# Patient Record
Sex: Male | Born: 1980 | Race: Black or African American | Hispanic: No | Marital: Single | Smoking: Never smoker
Health system: Southern US, Community
[De-identification: ages and names within clinical notes are randomized; demographics above are authoritative.]

## PROBLEM LIST (undated history)

## (undated) HISTORY — PX: KNEE ARTHROPLASTY: SHX992

---

## 2015-02-06 ENCOUNTER — Emergency Department (HOSPITAL_COMMUNITY)
Admission: EM | Admit: 2015-02-06 | Discharge: 2015-02-06 | Disposition: A | Discharge status: Home / Self Care | Payer: Self-pay | Attending: Emergency Medicine | Admitting: Emergency Medicine

## 2015-02-06 ENCOUNTER — Encounter (HOSPITAL_COMMUNITY): Payer: Self-pay | Admitting: Emergency Medicine

## 2015-02-06 DIAGNOSIS — B86 Scabies: Secondary | ICD-10-CM | POA: Insufficient documentation

## 2015-02-06 DIAGNOSIS — B354 Tinea corporis: Secondary | ICD-10-CM | POA: Insufficient documentation

## 2015-02-06 MED ORDER — CLOTRIMAZOLE 1 % EX CREA: TOPICAL_CREAM | CUTANEOUS | Status: AC

## 2015-02-06 MED ORDER — CLOTRIMAZOLE 1 % EX CREA
TOPICAL_CREAM | Freq: Once | CUTANEOUS | Status: AC
  Administered 2015-02-06: 21:00:00 via TOPICAL
  Filled 2015-02-06: qty 15

## 2015-02-06 MED ORDER — PERMETHRIN 5 % EX CREA
TOPICAL_CREAM | Freq: Once | CUTANEOUS | Status: AC
  Administered 2015-02-06: 21:00:00 via TOPICAL
  Filled 2015-02-06: qty 60

## 2015-02-06 NOTE — ED Notes (Signed)
The patient said he was cleaning a house that was filthy.  He says he started itching about 3 to 4 weeks ago and then he started getting sores al over his body.  The patient said this house was abandoned house and it had a lot of trash, dog feces and other nastiness in it.  [facility] and [facility] told him he had scabies and gave him a cream.  He says the cream did not work.  He is in pain from the sores and the itching.

## 2015-02-06 NOTE — ED Provider Notes (Signed)
CSN: [id number]     Arrival date & time 02/06/15  1935 History   First MD Initiated Contact with Patient 02/06/15 2029     Chief Complaint  Patient presents with  . Rash    The patient said he was cleaning a house that was filthy.  He says he started itching about 3 to 4 weeks ago and then he started getting sores al over his body.     (Consider location/radiation/quality/duration/timing/severity/associated sxs/prior Treatment) HPI Comments: The patient said he was cleaning a house that was filthy. He says he started itching about 3 to 4 weeks ago and then he started getting sores al over his body. The patient said this house was abandoned house and it had a lot of trash, dog feces and other nastiness in it. He went to [facility] and they told him he had scabies and gave him a cream. He states he was unable to afford the cream to use for his rash. Denies any fevers, chills, nausea, vomiting.    History reviewed. No pertinent past medical history. Past Surgical History  Procedure Laterality Date  . Knee arthroplasty     History reviewed. No pertinent family history. History  Substance Use Topics  . Smoking status: Never Smoker   . Smokeless tobacco: Never Used  . Alcohol Use: Yes     Comment: occ    Review of Systems  Skin: Positive for rash.  All other systems reviewed and are negative.     Allergies  Review of patient's allergies indicates not on file.  Home Medications   Prior to Admission medications   Medication Sig Start Date End Date Taking? Authorizing Provider  clotrimazole (LOTRIMIN) 1 % cream Apply to affected area 2 times daily 02/06/15   Johna Kearl L Saydi Kobel, PA-C   BP 134/84 mmHg  Pulse 56  Temp(Src) 98.4 F (36.9 C) (Oral)  Resp 18  SpO2 99% Physical Exam  Constitutional: He is oriented to person, place, and time. He appears well-developed and well-nourished. No distress.  HENT:  Head: Normocephalic and atraumatic.  Right Ear: External  ear normal.  Left Ear: External ear normal.  Nose: Nose normal.  Mouth/Throat: Oropharynx is clear and moist.  Eyes: Conjunctivae are normal.  Neck: Normal range of motion. Neck supple.  Cardiovascular: Normal rate, regular rhythm and normal heart sounds.   Pulmonary/Chest: Effort normal and breath sounds normal. No respiratory distress.  Abdominal: Soft. There is no tenderness.  Musculoskeletal: Normal range of motion.  Neurological: He is alert and oriented to person, place, and time.  Skin: Skin is warm and dry. Rash noted. He is not diaphoretic.  Erythematous maculopapules noted in between fingers on bilateral hands.  Annular lesions with central clearing without erythema or warmth noted on bilateral forearms and bilateral LE.   Psychiatric: He has a normal mood and affect.  Nursing note and vitals reviewed.   ED Course  Procedures (including critical care time) Medications  clotrimazole (LOTRIMIN) 1 % cream ( Topical Given 02/06/15 2111)  permethrin (ELIMITE) 5 % cream ( Topical Given 02/06/15 2111)    Labs Review Labs Reviewed - No data to display  Imaging Review No results found.   EKG Interpretation None      MDM   Final diagnoses:  Scabies  Tinea corporis    Filed Vitals:   02/06/15 2057  BP: 134/84  Pulse: 56  Temp: 98.4 F (36.9 C)  Resp: 18   Afebrile, NAD, non-toxic appearing, AAOx4.   1) Scabies:  Discussed diagnosis & treatment of scabies with parents.  They have been advised to followup with her primary care doctor 2 weeks after treatment.  They have also been advised to clean entire household including washing sheets and using R.I.D. spray in the car and on sofa.   The use of permethrin cream was discussed as well, they were told to use cream from head to toe & leave on child for 8-12 hours.    2) Tinea corporis: No evidence of super imposed infection. Will prescribe lotrimin cream.   Return precautions discussed. Patient is agreeable to plan.  Patient is stable at time of discharge      Jeannetta EllisJennifer L Jagger Beahm, PA-C 02/07/15 2113  Vanetta MuldersScott Zackowski, MD 02/08/15 2256

## 2015-02-06 NOTE — Discharge Instructions (Signed)
Please follow up with your primary care physician in 1-2 days. If you do not have one please call the [facility] and wellness Center number listed above. Please read all discharge instructions and return precautions.    Scabies Scabies are small bugs (mites) that burrow under the skin and cause red bumps and severe itching. These bugs can only be seen with a microscope. Scabies are highly contagious. They can spread easily from person to person by direct contact. They are also spread through sharing clothing or linens that have the scabies mites living in them. It is not unusual for an entire family to become infected through shared towels, clothing, or bedding.  HOME CARE INSTRUCTIONS   Your caregiver may prescribe a cream or lotion to kill the mites. If cream is prescribed, massage the cream into the entire body from the neck to the bottom of both feet. Also massage the cream into the scalp and face if your child is less than 36 year old. Avoid the eyes and mouth. Do not wash your hands after application.  Leave the cream on for 8 to 12 hours. Your child should bathe or shower after the 8 to 12 hour application period. Sometimes it is helpful to apply the cream to your child right before bedtime.  One treatment is usually effective and will eliminate approximately 95% of infestations. For severe cases, your caregiver may decide to repeat the treatment in 1 week. Everyone in your household should be treated with one application of the cream.  New rashes or burrows should not appear within 24 to 48 hours after successful treatment. However, the itching and rash may last for 2 to 4 weeks after successful treatment. Your caregiver may prescribe a medicine to help with the itching or to help the rash go away more quickly.  Scabies can live on clothing or linens for up to 3 days. All of your child's recently used clothing, towels, stuffed toys, and bed linens should be washed in hot water and then dried  in a dryer for at least 20 minutes on high heat. Items that cannot be washed should be enclosed in a plastic bag for at least 3 days.  To help relieve itching, bathe your child in a cool bath or apply cool washcloths to the affected areas.  Your child may return to school after treatment with the prescribed cream. SEEK MEDICAL CARE IF:   The itching persists longer than 4 weeks after treatment.  The rash spreads or becomes infected. Signs of infection include red blisters or yellow-tan crust. Document Released: 11/28/2005 Document Revised: 02/20/2012 Document Reviewed: 04/08/2009 [facility] Patient Information 2015 [place], [place]. This information is not intended to replace advice given to you by your health care provider. Make sure you discuss any questions you have with your health care provider.  Body Ringworm Ringworm (tinea corporis) is a fungal infection of the skin on the body. This infection is not caused by worms, but is actually caused by a fungus. Fungus normally lives on the top of your skin and can be useful. However, in the case of ringworms, the fungus grows out of control and causes a skin infection. It can involve any area of skin on the body and can spread easily from one person to another (contagious). Ringworm is a common problem for children, but it can affect adults as well. Ringworm is also often found in athletes, especially wrestlers who share equipment and mats.  CAUSES  Ringworm of the body is  caused by a fungus called dermatophyte. It can spread by:  Touchingother people who are infected.  Touchinginfected pets.  Touching or sharingobjects that have been in contact with the infected person or pet (hats, combs, towels, clothing, sports equipment). SYMPTOMS   Itchy, raised red spots and bumps on the skin.  Ring-shaped rash.  Redness near the border of the rash with a clear center.  Dry and scaly skin on or around the rash. Not every person develops a  ring-shaped rash. Some develop only the red, scaly patches. DIAGNOSIS  Most often, ringworm can be diagnosed by performing a skin exam. Your caregiver may choose to take a skin scraping from the affected area. The sample will be examined under the microscope to see if the fungus is present.  TREATMENT  Body ringworm may be treated with a topical antifungal cream or ointment. Sometimes, an antifungal shampoo that can be used on your body is prescribed. You may be prescribed antifungal medicines to take by mouth if your ringworm is severe, keeps coming back, or lasts a long time.  HOME CARE INSTRUCTIONS   Only take over-the-counter or prescription medicines as directed by your caregiver.  Wash the infected area and dry it completely before applying yourcream or ointment.  When using antifungal shampoo to treat the ringworm, leave the shampoo on the body for 3-5 minutes before rinsing.   Wear loose clothing to stop clothes from rubbing and irritating the rash.  Wash or change your bed sheets every night while you have the rash.  Have your pet treated by your veterinarian if it has the same infection. To prevent ringworm:   Practice good hygiene.  Wear sandals or shoes in public places and showers.  Do not share personal items with others.  Avoid touching red patches of skin on other people.  Avoid touching pets that have bald spots or wash your hands after doing so. SEEK MEDICAL CARE IF:   Your rash continues to spread after 7 days of treatment.  Your rash is not gone in 4 weeks.  The area around your rash becomes red, warm, tender, and swollen. Document Released: 11/25/2000 Document Revised: 08/22/2012 Document Reviewed: 06/11/2012 [facility] Patient Information 2015 [place]. This information is not intended to replace advice given to you by your health care provider. Make sure you discuss any questions you have with your health care provider.

## 2015-02-19 ENCOUNTER — Encounter (HOSPITAL_COMMUNITY): Payer: Self-pay

## 2015-02-19 ENCOUNTER — Emergency Department (HOSPITAL_COMMUNITY)
Admission: EM | Admit: 2015-02-19 | Discharge: 2015-02-19 | Disposition: A | Discharge status: Home / Self Care | Payer: Self-pay | Attending: Emergency Medicine | Admitting: Emergency Medicine

## 2015-02-19 DIAGNOSIS — K029 Dental caries, unspecified: Secondary | ICD-10-CM | POA: Insufficient documentation

## 2015-02-19 DIAGNOSIS — K088 Other specified disorders of teeth and supporting structures: Secondary | ICD-10-CM | POA: Insufficient documentation

## 2015-02-19 DIAGNOSIS — Z79899 Other long term (current) drug therapy: Secondary | ICD-10-CM | POA: Insufficient documentation

## 2015-02-19 DIAGNOSIS — K0381 Cracked tooth: Secondary | ICD-10-CM | POA: Insufficient documentation

## 2015-02-19 MED ORDER — HYDROCODONE-ACETAMINOPHEN 5-325 MG PO TABS: 1.0000 | ORAL_TABLET | ORAL | Status: AC | PRN

## 2015-02-19 MED ORDER — IBUPROFEN 800 MG PO TABS: 800.0000 mg | ORAL_TABLET | Freq: Three times a day (TID) | ORAL | Status: AC | PRN

## 2015-02-19 MED ORDER — PENICILLIN V POTASSIUM 500 MG PO TABS: 500.0000 mg | ORAL_TABLET | Freq: Four times a day (QID) | ORAL | Status: AC

## 2015-02-19 NOTE — Discharge Instructions (Signed)
Dental Care and Dentist Visits °Dental care supports good overall health. Regular dental visits can also help you avoid dental pain, bleeding, infection, and other more serious health problems in the future. It is important to keep the mouth healthy because diseases in the teeth, gums, and other oral tissues can spread to other areas of the body. Some problems, such as diabetes, heart disease, and pre-term labor have been associated with poor oral health.  °See your dentist every 6 months. If you experience emergency problems such as a toothache or broken tooth, go to the dentist right away. If you see your dentist regularly, you may catch problems early. It is easier to be treated for problems in the early stages.  °WHAT TO EXPECT AT A DENTIST VISIT  °Your dentist will look for many common oral health problems and recommend proper treatment. At your regular dental visit, you can expect: °· Gentle cleaning of the teeth and gums. This includes scraping and polishing. This helps to remove the sticky substance around the teeth and gums (plaque). Plaque forms in the mouth shortly after eating. Over time, plaque hardens on the teeth as tartar. If tartar is not removed regularly, it can cause problems. Cleaning also helps remove stains. °· Periodic X-rays. These pictures of the teeth and supporting bone will help your dentist assess the health of your teeth. °· Periodic fluoride treatments. Fluoride is a natural mineral shown to help strengthen teeth. Fluoride treatment involves applying a fluoride gel or varnish to the teeth. It is most commonly done in children. °· Examination of the mouth, tongue, jaws, teeth, and gums to look for any oral health problems, such as: °· Cavities (dental caries). This is decay on the tooth caused by plaque, sugar, and acid in the mouth. It is best to catch a cavity when it is small. °· Inflammation of the gums caused by plaque buildup (gingivitis). °· Problems with the mouth or malformed  or misaligned teeth. °· Oral cancer or other diseases of the soft tissues or jaws.  °KEEP YOUR TEETH AND GUMS HEALTHY °For healthy teeth and gums, follow these general guidelines as well as your dentist's specific advice: °· Have your teeth professionally cleaned at the dentist every 6 months. °· Brush twice daily with a fluoride toothpaste. °· Floss your teeth daily.  °· Ask your dentist if you need fluoride supplements, treatments, or fluoride toothpaste. °· Eat a healthy diet. Reduce foods and drinks with added sugar. °· Avoid smoking. °TREATMENT FOR ORAL HEALTH PROBLEMS °If you have oral health problems, treatment varies depending on the conditions present in your teeth and gums. °· Your caregiver will most likely recommend good oral hygiene at each visit. °· For cavities, gingivitis, or other oral health disease, your caregiver will perform a procedure to treat the problem. This is typically done at a separate appointment. Sometimes your caregiver will refer you to another dental specialist for specific tooth problems or for surgery. °SEEK IMMEDIATE DENTAL CARE IF: °· You have pain, bleeding, or soreness in the gum, tooth, jaw, or mouth area. °· A permanent tooth becomes loose or separated from the gum socket. °· You experience a blow or injury to the mouth or jaw area. °Document Released: 08/10/2011 Document Revised: 02/20/2012 Document Reviewed: 08/10/2011 °ExitCare® Patient Information ©2015 [employer]. This information is not intended to replace advice given to you by your health care provider. Make sure you discuss any questions you have with your health care provider. °Dental Caries °Dental caries (also called tooth decay) is   the most common oral disease. It can occur at any age but is more common in children and young adults.  °HOW DENTAL CARIES DEVELOPS  °The process of decay begins when bacteria and foods (particularly sugars and starches) combine in your mouth to produce plaque. Plaque is a  substance that sticks to the hard, outer surface of a tooth (enamel). The bacteria in plaque produce acids that attack enamel. These acids may also attack the root surface of a tooth (cementum) if it is exposed. Repeated attacks dissolve these surfaces and create holes in the tooth (cavities). If left untreated, the acids destroy the other layers of the tooth.  °RISK FACTORS °· Frequent sipping of sugary beverages.   °· Frequent snacking on sugary and starchy foods, especially those that easily get stuck in the teeth.   °· Poor oral hygiene.   °· Dry mouth.   °· Substance abuse such as methamphetamine abuse.   °· Broken or poor-fitting dental restorations.   °· Eating disorders.   °· Gastroesophageal reflux disease (GERD).   °· Certain radiation treatments to the head and neck. °SYMPTOMS °In the early stages of dental caries, symptoms are seldom present. Sometimes white, chalky areas may be seen on the enamel or other tooth layers. In later stages, symptoms may include: °· Pits and holes on the enamel. °· Toothache after sweet, hot, or cold foods or drinks are consumed. °· Pain around the tooth. °· Swelling around the tooth. °DIAGNOSIS  °Most of the time, dental caries is detected during a regular dental checkup. A diagnosis is made after a thorough medical and dental history is taken and the surfaces of your teeth are checked for signs of dental caries. Sometimes special instruments, such as lasers, are used to check for dental caries. Dental X-ray exams may be taken so that areas not visible to the eye (such as between the contact areas of the teeth) can be checked for cavities.  °TREATMENT  °If dental caries is in its early stages, it may be reversed with a fluoride treatment or an application of a remineralizing agent at the dental office. Thorough brushing and flossing at home is needed to aid these treatments. If it is in its later stages, treatment depends on the location and extent of tooth destruction:   °· If a small area of the tooth has been destroyed, the destroyed area will be removed and cavities will be filled with a material such as gold, silver amalgam, or composite resin.   °· If a large area of the tooth has been destroyed, the destroyed area will be removed and a cap (crown) will be fitted over the remaining tooth structure.   °· If the center part of the tooth (pulp) is affected, a procedure called a root canal will be needed before a filling or crown can be placed.   °· If most of the tooth has been destroyed, the tooth may need to be pulled (extracted). °HOME CARE INSTRUCTIONS °You can prevent, stop, or reverse dental caries at home by practicing good oral hygiene. Good oral hygiene includes: °· Thoroughly cleaning your teeth at least twice a day with a toothbrush and dental floss.   °· Using a fluoride toothpaste. A fluoride mouth rinse may also be used if recommended by your dentist or health care provider.   °· Restricting the amount of sugary and starchy foods and sugary liquids you consume.   °· Avoiding frequent snacking on these foods and sipping of these liquids.   °· Keeping regular visits with a dentist for checkups and cleanings. °PREVENTION  °·   Practice good oral hygiene. °· Consider a dental sealant. A dental sealant is a coating material that is applied by your dentist to the pits and grooves of teeth. The sealant prevents food from being trapped in them. It may protect the teeth for several years. °· Ask about fluoride supplements if you live in a community without fluorinated water or with water that has a low fluoride content. Use fluoride supplements as directed by your dentist or health care provider. °· Allow fluoride varnish applications to teeth if directed by your dentist or health care provider. °Document Released: 08/20/2002 Document Revised: 04/14/2014 Document Reviewed: 11/30/2012 °ExitCare® Patient Information ©2015 [employer]. This information is not intended to  replace advice given to you by your health care provider. Make sure you discuss any questions you have with your health care provider. ° ° ° °Emergency Department Resource Guide °1) Find a Doctor and Pay Out of Pocket °Although you won't have to find out who is covered by your insurance plan, it is a good idea to ask around and get recommendations. You will then need to call the office and see if the doctor you have chosen will accept you as a new patient and what types of options they offer for patients who are self-pay. Some doctors offer discounts or will set up payment plans for their patients who do not have insurance, but you will need to ask so you aren't surprised when you get to your appointment. ° °2) Contact Your Local Health Department °Not all health departments have doctors that can see patients for sick visits, but many do, so it is worth a call to see if yours does. If you don't know where your local health department is, you can check in your phone book. The CDC also has a tool to help you locate your state's health department, and many state websites also have listings of all of their local health departments. ° °3) Find a Walk-in Clinic °If your illness is not likely to be very severe or complicated, you may want to try a walk in clinic. These are popping up all over the country in pharmacies, drugstores, and shopping centers. They're usually staffed by nurse practitioners or physician assistants that have been trained to treat common illnesses and complaints. They're usually fairly quick and inexpensive. However, if you have serious medical issues or chronic medical problems, these are probably not your best option. ° °No Primary Care Doctor: °- Call [facility] at  832-8000 - they can help you locate a primary care doctor that  accepts your insurance, provides certain services, etc. °- Physician Referral Service- 1-800-533-3463 ° °Chronic Pain Problems: °Organization         Address  Phone    Notes  °[facility]  (336) 297-2271 Patients need to be referred by their primary care doctor.  ° °Medication Assistance: °Organization         Address  Phone   Notes  °[place] [facility] [address] °[place], [place] [address] (336) 641-8030 --Must be a resident of [place] °-- Must have NO insurance coverage whatsoever (no Medicaid/ Medicare, etc.) °-- The pt. MUST have a primary care doctor that directs their care regularly and follows them in the community °  °[facility]  (866) 331-1348   °[employer]  (888) 892-1162   ° °Agencies that provide inexpensive medical care: °Organization         Address  Phone     Notes  °[facility]  (336) 832-8035   °[facility]    (336) 832-7272   °[facility] [address] °[place], [place] [address] (336) 832-4777   °[facility] [address], °[place] (336) 271-4999   °[facility]    (336) 373-0678   °[facility]    (336) 272-1050   °[facility] ° [address], [place] Phone:  (336) 832-4444, Fax:  (336) 832-4440 Hours of Operation:  9 am - 6 pm, M-F.  Also accepts Medicaid/Medicare and self-pay.  °[facility] ° [address], [place] Phone: (336) 832-3150, Fax: (336) 832-3151. Hours of Operation:  8:30 am - 5:30 pm, M-F.  Also accepts Medicaid and self-pay.  °[facility] [place] [address], [place] Phone: (336) 878-6027   °[facility] [address], [place], [place] (336)723-1848, Ext. 123 Mondays & Thursdays: 7-9 AM.  First 15 patients are seen on a first come, first serve basis. °  ° °Medicaid-accepting [place] Providers: ° °Organization         Address  Phone   Notes  °[facility] [address], [place] (336) 641-2100 Also accepts self-pay patients.  °[facility]  [address], [place] ° (336) 856-9996   °[facility] [address], [place] (336) 288-8857   °[facility] [address] [place] [address], [place] (336) 299-7000   °Veita Bland [address], [place]  ° (336) 373-1557 Only accepts [place] Access Medicaid patients after they have their name applied to their card.  ° °Self-Pay (no insurance) in [place]: ° °Organization         Address  Phone   Notes  °Sickle Cell Patients, [facility] [address], [place] (336) 832-1970   °[facility] [address], [place] (336) 832-4400   °[facility] ° [address], [place] (336) 992-4800   °[facility]/Dr. Osei-Bonsu ° [address] [place] [address], [place] or [address], [place] (336) 841-8500 Phone number for both [place] and [place] locations is the same.  °[facility] [address], [place] (336) 299-0000   °[facility] [address] [place] [address],  or [address] (336) 852-7530 °(336) 878-2260   °[facility] [address],  (336) 350-1642, phone; (336) 294-5005, fax Sees patients 1st and 3rd Saturday of every month.  Must not qualify for public or private insurance (i.e. Medicaid, Medicare, [place] Health Choice, Veterans' Benefits) • Household income should be no more than 200% of the poverty level •The clinic cannot treat you if you are pregnant or think you are pregnant • Sexually transmitted diseases are not treated at the clinic.  ° ° °Dental Care: °Organization         Address  Phone  Notes  °[place] [facility] [address],  (336) 641-6152 Accepts children up to age 21 who are enrolled in Medicaid or [place] Health Choice; pregnant women with a Medicaid card; and children who have  applied for Medicaid or [place] Health Choice, but were declined, whose parents can pay a reduced fee at time of service.  °[place]   [facility] [place]  [address], [place] (336) 641-7733 Accepts children up to age 21 who are enrolled in Medicaid or [place] Health Choice; pregnant women with a Medicaid card; and children who have applied for Medicaid or [place] Health Choice, but were declined, whose parents can pay a reduced fee at time of service.  °[facility] ° [address], [place] (336) 641-4533 Patients are seen by appointment only. Walk-ins are not accepted. [facility] will see patients 18 years of age and older. °Monday - Tuesday (8am-5pm) °Most Wednesdays (8:30-5pm) °$30 per visit, cash only  °[facility] ° [address], [place] (336) 641-4533 Patients are seen by appointment only. Walk-ins are not accepted. [facility] will see patients 18 years of age and older. °One Wednesday Evening (Monthly: Volunteer Based).  $30 per visit, cash only  °[facility]  (919) 537-3737 for adults; Children under age 4, call Graduate Pediatric Dentistry at ([phone]) [phone]. Children aged 4-14, please call (919) [phone] to request a pediatric application. ° Dental services are provided in all areas of dental care including fillings, crowns and bridges, complete and partial dentures, implants, gum treatment, root canals, and extractions. Preventive care is also provided. Treatment is provided to both adults and children. °Patients are selected via a lottery and there is often a waiting list. °  °[facility] [address], °[place] ° (336) 763-8833 [email or web address] °  °[facility] [address], [place], [place] (336)723-1848, Ext. 123 Second and Fourth Thursday of each month, opens at 6:30 AM; Clinic ends at 9 AM.  Patients are seen on a first-come first-served basis, and a  limited number are seen during each clinic.  ° °[facility] ° [address], [place], [place] (336) 723-7904   Eligibility Requirements °You must have lived in [place], or [place] counties for at least the last three months. °  You cannot be eligible for state or federal sponsored healthcare insurance, including Veterans Administration, Medicaid, or Medicare. °  You generally cannot be eligible for healthcare insurance through your employer.  °  How to apply: °Eligibility screenings are held every Tuesday and Wednesday afternoon from 1:00 pm until 4:00 pm. You do not need an appointment for the interview!  °[facility] [address], [place], [place] 336-631-2330   °[facility]  336-342-8273   °[facility]  336-703-3100   °[facility]  336-570-6415   ° °Behavioral Health Resources in the Community: °Intensive Outpatient Programs °Organization         Address  Phone  Notes  °[facility] [address], [place], [place] 336-878-6098   °[facility] [address], [place], [place] 336-832-9800   °[facility] [address], [place], [place] ° 336-882-2125   °[facility] [address],  °[place], [place] 1-800-853-5163 or 336-641-4981   °Substance Abuse Resources °Organization         Address  Phone  Notes  °[facility]  336-882-2125   °[facility]  336-784-9470   °[facility]  336-285-9073   °[facility]  336-845-3988   °Residential & Outpatient Substance Abuse Program  1-800-659-3381   °Psychological Services °Organization         Address  Phone  Notes  °[facility]  336- 832-9600   °[facility]  336- 378-7881   °[facility] [address], [place] 1-800-853-5163 or 336-641-4981   ° °Mobile Crisis Teams °Organization          Address  Phone  Notes  °[facility], Mobile Crisis Care Unit  1-877-626-1772   °Assertive °[facility] ° [address] [place], [place] 336-834-9664   °[facility] [address] °[place] 336-554-5454   ° °Self-Help/Support Groups °Organization         Address  Phone             Notes  °[facility] - variety of support groups  336- 373-1402 Call for more information  °Narcotics Anonymous (NA), [facility] [address], °[place]  2 meetings at this location  ° °Residential Treatment Programs °Organization         Address  Phone  Notes  °[facility] [address],    °[place]  1-866-801-8205   °[facility] ° [address], [place], [place] 704-293-8524   °[facility] [address], [place] 336-845-3988 Admissions: 8am-3pm M-F  °[facility] [address]    °[place], [place] 336-841-1104   °[facility] [address], [place], [place] 336-379-7146   °[facility] [address]  °[place], [place] 336-285-9073   °[facility] - Intensive Outpatient [address], [place], [place] 336-852-3033   °[facility] [address]  °[place], [place] 1-877-615-2722 or 336-784-9470   °[facility] [address] [place], [place] 336-227-7417 Accepts Medicaid  °[facility] [address]  ° [place] 1-800-659-3381 Substance Abuse/Addiction Treatment  ° °[place] Behavioral Health Resources °Organization         Address  Phone  Notes  °[facility]  (888) 581-9988   °Julie Brannon, PhD [address] [place], [place]   (336) 349-5553 or (336) 951-0000   °[facility]   [address] °[place], [place] (336) 349-4454   °[facility] [address], [place], [place] (336) 342-8316 Insurance/Medicaid/sponsorship  through [facility]  °[facility] [address]                                    [place], [place] (336) 342-8316 Therapy/tele-psych/case  °[facility] [address]  ° [place], [place] (336) 349-2233    °Dr. Arfeen  (336) 349-4544   °[facility] [place]  [employer] [place] [facility] 1) [address], [place] °2) [address], [place] °3)  [address], [place] (336) 349-3220 °(336) 342-7768 ° °(336) 342-8140   °[place] [facility] (336) 342-1394 or (336) 342-3537 (After Hours)    ° ° ° °

## 2015-02-19 NOTE — ED Notes (Signed)
Pt c/o upper left dental abscess and pain x3-4 days.

## 2015-02-19 NOTE — ED Provider Notes (Signed)
TIME SEEN: 6:35 AM  CHIEF COMPLAINT: Dental pain  HPI: Pt is a 34 y.o. male with no significant past history presents emergency department with left upper dental pain for the past several days. No facial swelling. No fever. Is concerned he may have an abscess. No drainage. Has a cavity in fractured tooth in this area. Does not have a dentist. No trismus or drooling.  ROS: See HPI Constitutional: no fever  Eyes: no drainage  ENT: no runny nose   Cardiovascular:  no chest pain  Resp: no SOB  GI: no vomiting GU: no dysuria Integumentary: no rash  Allergy: no hives  Musculoskeletal: no leg swelling  Neurological: no slurred speech ROS otherwise negative  PAST MEDICAL HISTORY/PAST SURGICAL HISTORY:  No past medical history on file.  MEDICATIONS:  Prior to Admission medications   Medication Sig Start Date End Date Taking? Authorizing Provider  clotrimazole (LOTRIMIN) 1 % cream Apply to affected area 2 times daily 02/06/15   Francee PiccoloJennifer Piepenbrink, PA-C    ALLERGIES:  Not on File  SOCIAL HISTORY:  History  Substance Use Topics  . Smoking status: Never Smoker   . Smokeless tobacco: Never Used  . Alcohol Use: Yes     Comment: occ    FAMILY HISTORY: No family history on file.  EXAM: BP 134/82 mmHg  Pulse 65  Temp(Src) 98.4 F (36.9 C) (Oral)  Resp 16  Ht 5\' 11"  (1.803 m)  Wt 190 lb (86.183 kg)  BMI 26.51 kg/m2  SpO2 97% CONSTITUTIONAL: Alert and oriented and responds appropriately to questions. Well-appearing; well-nourished, nontoxic, no distress HEAD: Normocephalic EYES: Conjunctivae clear, PERRL ENT: normal nose; no rhinorrhea; moist mucous membranes; pharynx without lesions noted, no uvular deviation, trismus or drooling, no Ludwig angina, tongue sits flat in the bottom of the mouth, normal phonation, no tonsillar hypertrophy or exudate, patient has a fractured upper left-sided molar with dental [place] with no obvious sign of abscess. No drainage. NECK: Supple, no  meningismus, no LAD  CARD: RRR; S1 and S2 appreciated; no murmurs, no clicks, no rubs, no gallops RESP: Normal chest excursion without splinting or tachypnea; breath sounds clear and equal bilaterally; no wheezes, no rhonchi, no rales,  ABD/GI: Normal bowel sounds; non-distended; soft, non-tender, no rebound, no guarding BACK:  The back appears normal and is non-tender to palpation, there is no CVA tenderness EXT: Normal ROM in all joints; non-tender to palpation; no edema; normal capillary refill; no cyanosis    SKIN: Normal color for age and race; warm NEURO: Moves all extremities equally PSYCH: The patient's mood and manner are appropriate. Grooming and personal hygiene are appropriate.  MEDICAL DECISION MAKING: Patient here with pain from dental caries. We'll discharge with ibuprofen, Vicodin and penicillin. No obvious abscess on exam. No sign of Ludwig angina on exam. Hemodynamically stable, afebrile, nontoxic. We'll discharge home with pain medication and antibiotics. We'll give dental follow-up information. Discussed return precautions. Patient verbalizes understanding and is comfortable with plan.      Layla MawKristen N Ramil [place], DO 02/19/15 [phone]

## 2015-07-02 ENCOUNTER — Encounter (HOSPITAL_COMMUNITY): Payer: Self-pay | Admitting: *Deleted

## 2015-07-02 ENCOUNTER — Emergency Department (HOSPITAL_COMMUNITY)
Admission: EM | Admit: 2015-07-02 | Discharge: 2015-07-02 | Disposition: A | Discharge status: Home / Self Care | Payer: Self-pay | Attending: Emergency Medicine | Admitting: Emergency Medicine

## 2015-07-02 DIAGNOSIS — Z792 Long term (current) use of antibiotics: Secondary | ICD-10-CM | POA: Insufficient documentation

## 2015-07-02 DIAGNOSIS — L309 Dermatitis, unspecified: Secondary | ICD-10-CM

## 2015-07-02 DIAGNOSIS — L301 Dyshidrosis [pompholyx]: Secondary | ICD-10-CM | POA: Insufficient documentation

## 2015-07-02 DIAGNOSIS — Z79899 Other long term (current) drug therapy: Secondary | ICD-10-CM | POA: Insufficient documentation

## 2015-07-02 MED ORDER — HYDROCORTISONE 2.5 % EX LOTN: TOPICAL_LOTION | Freq: Two times a day (BID) | CUTANEOUS | Status: AC

## 2015-07-02 NOTE — ED Provider Notes (Signed)
CSN: [id number]     Arrival date & time 07/02/15  1750 History   This chart was scribed for Kurt Forth, PA-C working with No att. providers found by Elveria Rising, ED Scribe. This patient was seen in room TR05C/TR05C and the patient's care was started at 6:16 PM.    Chief Complaint  Patient presents with  . Rash   The history is provided by the patient and medical records. No language interpreter was used.   HPI Comments: Kurt Aguirre is a 34 y.o. male who presents to the Emergency Department complaining of rash to ventral forearms and hands, onset yesterday. Patient denies pain or itching to rash on hands, but reports itching to forearms. 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 Patient reports that he wears gloves, glasses, hard hat, long pants and cotton short sleeve when working; his forearms are the only skin exposed. Patient wears three pairs of gloves and states that his hands sweat profusely inside of the layers during his 14-16 hour workdays.  Patient states that he had been staying in a hotel for 1-2 weeks now and that it is cleaned each day. Patient denies history of eczema.   History reviewed. No pertinent past medical history. Past Surgical History  Procedure Laterality Date  . Knee arthroplasty     History reviewed. No pertinent family history. History  Substance Use Topics  . Smoking status: Never Smoker   . Smokeless tobacco: Never Used  . Alcohol Use: Yes     Comment: occ    Review of Systems  Constitutional: Negative for fever, chills, diaphoresis, appetite change, fatigue and unexpected weight change.  HENT: Negative for mouth sores.   Eyes: Negative for visual disturbance.  Respiratory: Negative for cough, chest tightness, shortness of breath and wheezing.   Cardiovascular: Negative for chest pain.  Gastrointestinal: Negative for nausea, vomiting, abdominal pain, diarrhea and constipation.   Endocrine: Negative for polydipsia, polyphagia and polyuria.  Genitourinary: Negative for dysuria, urgency, frequency and hematuria.  Musculoskeletal: Negative for back pain and neck stiffness.  Skin: Positive for color change and rash.  Allergic/Immunologic: Negative for immunocompromised state.  Neurological: Negative for syncope, light-headedness and headaches.  Hematological: Does not bruise/bleed easily.  Psychiatric/Behavioral: Negative for sleep disturbance. The patient is not nervous/anxious.    Allergies  Review of patient's allergies indicates no known allergies.  Home Medications   Prior to Admission medications   Medication Sig Start Date End Date Taking? Authorizing Provider  clotrimazole (LOTRIMIN) 1 % cream Apply to affected area 2 times daily 02/06/15   Francee Piccolo, PA-C  HYDROcodone-acetaminophen (NORCO/VICODIN) 5-325 MG per tablet Take 1 tablet by mouth every 4 (four) hours as needed. 02/19/15   Kristen N Ward, DO  hydrocortisone 2.5 % lotion Apply topically 2 (two) times daily. 07/02/15   Esther Bradstreet, PA-C  ibuprofen (ADVIL,MOTRIN) 800 MG tablet Take 1 tablet (800 mg total) by mouth every 8 (eight) hours as needed for mild pain. 02/19/15   Kristen N Ward, DO  penicillin v potassium (VEETID) 500 MG tablet Take 1 tablet (500 mg total) by mouth 4 (four) times daily. 02/19/15   Kristen N Ward, DO   Triage Vitals: BP 128/78 mmHg  Pulse 86  Temp(Src) 98.3 F (36.8 C) (Oral)  Resp 20  SpO2 95% Physical Exam  Constitutional: He is oriented to person, place, and time. He appears well-developed and well-nourished. No distress.  HENT:  Head: Normocephalic and atraumatic.  Right  Ear: Tympanic membrane, external ear and ear canal normal.  Left Ear: Tympanic membrane, external ear and ear canal normal.  Nose: Nose normal. No mucosal edema or rhinorrhea.  Mouth/Throat: Uvula is midline. No uvula swelling. No oropharyngeal exudate, posterior oropharyngeal edema,  posterior oropharyngeal erythema or tonsillar abscesses.  No swelling of the uvula or oropharynx   Eyes: Conjunctivae are normal.  Neck: Normal range of motion.  Patent airway No stridor; normal phonation Handling secretions without difficulty  Cardiovascular: Normal rate, normal heart sounds and intact distal pulses.   No murmur heard. Pulmonary/Chest: Effort normal and breath sounds normal. No stridor. No respiratory distress. He has no wheezes.  No wheezes or rhonchi  Abdominal: Soft. Bowel sounds are normal. There is no tenderness.  Musculoskeletal: Normal range of motion. He exhibits no edema.  Neurological: He is alert and oriented to person, place, and time.  Skin: Skin is warm and dry. Rash noted. He is not diaphoretic.  Non erythematous papules covering hands and fingers. No excoriations. Patches noted on the medial aspect of bilateral elbows of papules with excoriation. No vesicles. Scaling noted. One small plaque similar to ones on elbows noted on left forearm.  No induration or fluctuance to indicate secondary infection  Psychiatric: He has a normal mood and affect.  Nursing note and vitals reviewed.   ED Course  Procedures (including critical care time)  COORDINATION OF CARE: 6:28 PM- Discussed treatment plan with patient at bedside and patient agreed to plan.   Labs Review Labs Reviewed - No data to display  Imaging Review No results found.   EKG Interpretation None      MDM   Final diagnoses:  Dyshidrotic hand dermatitis  Eczema   Kurt Aguirre presents with rash to his hands is consistent with dyshidrotic eczema. Patient is spending long hours with his hands sweaty inside rubber gloves had a new job which is likely the culprit. Patient also with several lesions to his forearm and bilateral elbows consistent with eczema. Patient will be discharged home with hydrocortisone lotion. Discussed at length good skin care with his hands and decreasing the  amount of time that they remain wet.    Patient denies any difficulty breathing or swallowing.  Pt has a patent airway without stridor and is handling secretions without difficulty; no angioedema. No blisters, no pustules, no warmth, no draining sinus tracts, no superficial abscesses, no bullous impetigo, no vesicles, no desquamation, no target lesions with dusky purpura or a central bulla. Not tender to touch. No concern for superimposed infection. No concern for SJS, TEN, TSS, tick borne illness, syphilis or other life-threatening condition.   I personally performed the services described in this documentation, which was scribed in my presence. The recorded information has been reviewed and is accurate.   Dahlia Client Buena Boehm, PA-C 07/02/15 1846  Benjiman Core, MD 07/02/15 [phone]

## 2015-07-02 NOTE — ED Notes (Signed)
Pt reports rash and itching to hands and arms.

## 2015-07-02 NOTE — Discharge Instructions (Signed)
1. Medications: hydrocortisone lotion, usual home medications 2. Treatment: rest, drink plenty of fluids, keep hands as dry as possible 3. Follow Up: Please followup with your primary doctor and/or dermatology in 7 days for discussion of your diagnoses and further evaluation after today's visit; if you do not have a primary care doctor use the resource guide provided to find one; Please return to the ER for worsening symptoms, signs of infection or other concerns    Hand Dermatitis Hand dermatitis (dyshidrotic eczema) is a skin condition in which small, itchy, raised dots or fluid-filled blisters form over the palms of the hands. Outbreaks of hand dermatitis can last 3 to 4 weeks. CAUSES  The cause of hand dermatitis is unknown. However, it occurs most often in patients with a history of allergies such as:  Hay fever.  Allergic asthma.  Allergies to latex. Chemical exposure, injuries, and environmental irritants can make hand dermatitis worse. Washing your hands too frequently can remove natural oils, which can dry out the skin and contribute to outbreaks of hand dermatitis. SYMPTOMS  The most common symptom of hand dermatitis is intense itching. Cracks or grooves (fissures) on the fingers can also develop. Affected areas can be painful, especially areas where large blisters have formed. DIAGNOSIS Your caregiver can usually tell what the problem is by doing a physical exam. PREVENTION  Avoid excessive hand washing.  Avoid the use of harsh chemicals.  Wear protective gloves when handling products that can irritate your skin. TREATMENT  Steroid creams and ointments, such as over-the-counter 1% hydrocortisone cream, can reduce inflammation and improve moisture retention. These should be applied at least 2 to 4 times per day. Your caregiver may ask you to use a stronger prescription steroid cream to help speed the healing of blistered and cracked skin. In severe cases, oral steroid medicine  may be needed. If you have an infection, antibiotics may be needed. Your caregiver may also prescribe antihistamines. These medicines help reduce itching. HOME CARE INSTRUCTIONS  Only take over-the-counter or prescription medicines as directed by your caregiver.  You may use wet or cold compresses. This can help:  Alleviate itching.  Increase the effectiveness of topical creams.  Minimize blisters. SEEK MEDICAL CARE IF:  The rash is not better after 1 week of treatment.  Signs of infection develop, such as redness, tenderness, or yellowish-white fluid (pus).  The rash is spreading. Document Released: 11/28/2005 Document Revised: 02/20/2012 Document Reviewed: 04/27/2011 [facility] Patient Information 2015 [place], [place]. This information is not intended to replace advice given to you by your health care provider. Make sure you discuss any questions you have with your health care provider.

## 2015-07-26 DIAGNOSIS — R05 Cough: Secondary | ICD-10-CM | POA: Insufficient documentation

## 2015-07-26 DIAGNOSIS — Z79899 Other long term (current) drug therapy: Secondary | ICD-10-CM | POA: Insufficient documentation

## 2015-07-26 DIAGNOSIS — Z7952 Long term (current) use of systemic steroids: Secondary | ICD-10-CM | POA: Insufficient documentation

## 2015-07-26 DIAGNOSIS — Z792 Long term (current) use of antibiotics: Secondary | ICD-10-CM | POA: Insufficient documentation

## 2015-07-26 DIAGNOSIS — M6282 Rhabdomyolysis: Secondary | ICD-10-CM | POA: Insufficient documentation

## 2015-07-26 NOTE — ED Notes (Signed)
Pt started new job working on [employer], states for 3 weeks has had hiccups, muscle pains and bloody nose at times.

## 2015-07-27 ENCOUNTER — Emergency Department: Payer: Self-pay

## 2015-07-27 ENCOUNTER — Encounter: Payer: Self-pay | Admitting: Emergency Medicine

## 2015-07-27 ENCOUNTER — Emergency Department
Admission: EM | Admit: 2015-07-27 | Discharge: 2015-07-27 | Disposition: A | Discharge status: Home / Self Care | Payer: Self-pay | Attending: Emergency Medicine | Admitting: Emergency Medicine

## 2015-07-27 DIAGNOSIS — R252 Cramp and spasm: Secondary | ICD-10-CM

## 2015-07-27 DIAGNOSIS — M6282 Rhabdomyolysis: Secondary | ICD-10-CM

## 2015-07-27 LAB — CBC WITH DIFFERENTIAL/PLATELET
Basophils Absolute: 0.1 10*3/uL (ref 0–0.1)
Basophils Relative: 1 %
Eosinophils Absolute: 0.3 10*3/uL (ref 0–0.7)
Eosinophils Relative: 4 %
HEMATOCRIT: 42.4 % (ref 40.0–52.0)
Hemoglobin: 14.3 g/dL (ref 13.0–18.0)
Lymphocytes Relative: 31 %
Lymphs Abs: 2.1 10*3/uL (ref 1.0–3.6)
MCH: 31.6 pg (ref 26.0–34.0)
MCHC: 33.7 g/dL (ref 32.0–36.0)
MCV: 93.8 fL (ref 80.0–100.0)
MONO ABS: 0.9 10*3/uL (ref 0.2–1.0)
MONOS PCT: 13 %
NEUTROS ABS: 3.4 10*3/uL (ref 1.4–6.5)
Neutrophils Relative %: 51 %
Platelets: 204 10*3/uL (ref 150–440)
RBC: 4.52 MIL/uL (ref 4.40–5.90)
RDW: 14 % (ref 11.5–14.5)
WBC: 6.8 10*3/uL (ref 3.8–10.6)

## 2015-07-27 LAB — BASIC METABOLIC PANEL
ANION GAP: 6 (ref 5–15)
BUN: 29 mg/dL — ABNORMAL HIGH (ref 6–20)
CHLORIDE: 103 mmol/L (ref 101–111)
CO2: 27 mmol/L (ref 22–32)
CREATININE: 1.24 mg/dL (ref 0.61–1.24)
Calcium: 8.7 mg/dL — ABNORMAL LOW (ref 8.9–10.3)
GFR calc Af Amer: >60 mL/min (ref >60)
GFR calc non Af Amer: >60 mL/min (ref >60)
Glucose, Bld: 104 mg/dL — ABNORMAL HIGH (ref 65–99)
Potassium: 4.3 mmol/L (ref 3.5–5.1)
Sodium: 136 mmol/L (ref 135–145)

## 2015-07-27 LAB — URINALYSIS COMPLETE WITH MICROSCOPIC (ARMC ONLY)
BACTERIA UA: NONE SEEN
Bilirubin Urine: NEGATIVE
Glucose, UA: NEGATIVE mg/dL
Hgb urine dipstick: NEGATIVE
KETONES UR: NEGATIVE mg/dL
NITRITE: NEGATIVE
PH: 5 (ref 5.0–8.0)
PROTEIN: NEGATIVE mg/dL
SPECIFIC GRAVITY, URINE: 1.028 (ref 1.005–1.030)
Squamous Epithelial / LPF: NONE SEEN

## 2015-07-27 LAB — CK: Total CK: 1114 U/L — ABNORMAL HIGH (ref 49–397)

## 2015-07-27 MED ORDER — SODIUM CHLORIDE 0.9 % IV BOLUS (SEPSIS)
1000.0000 mL | Freq: Once | INTRAVENOUS | Status: AC
  Administered 2015-07-27: 1000 mL via INTRAVENOUS

## 2015-07-27 MED ORDER — DIAZEPAM 2 MG PO TABS: 2.0000 mg | ORAL_TABLET | Freq: Three times a day (TID) | ORAL | Status: AC | PRN

## 2015-07-27 NOTE — ED Provider Notes (Signed)
[facility] Emergency Department Provider Note  ____________________________________________  Time seen: Approximately 1:39 AM  I have reviewed the triage vital signs and the nursing notes.   HISTORY  Chief Complaint Muscle Pain    HPI Diar Berkel is a 34 y.o. male who presents to the ED from home with a chief complain of diffuse muscle cramps, occasional hiccups, occasional bloody nose and occasionally coughing up blood. 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 Yesterday patient wore a face mask which helped with his breathing issues but he felt like he became congested while wearing the mask. Denies fever, chills, chest pain, shortness of breath, abdominal pain, nausea, vomiting, diarrhea.   History reviewed. No pertinent past medical history.  There are no active problems to display for this patient.   Past Surgical History  Procedure Laterality Date  . Knee arthroplasty      Current Outpatient Rx  Name  Route  Sig  Dispense  Refill  . clotrimazole (LOTRIMIN) 1 % cream      Apply to affected area 2 times daily   15 g   0   . HYDROcodone-acetaminophen (NORCO/VICODIN) 5-325 MG per tablet   Oral   Take 1 tablet by mouth every 4 (four) hours as needed.   15 tablet   0   . hydrocortisone 2.5 % lotion   Topical   Apply topically 2 (two) times daily.   59 mL   0   . ibuprofen (ADVIL,MOTRIN) 800 MG tablet   Oral   Take 1 tablet (800 mg total) by mouth every 8 (eight) hours as needed for mild pain.   30 tablet   0   . penicillin v potassium (VEETID) 500 MG tablet   Oral   Take 1 tablet (500 mg total) by mouth 4 (four) times daily.   40 tablet   0     Allergies Review of patient's allergies indicates no known allergies.  History reviewed. No pertinent family history.  Social History Social History  Substance Use Topics  . Smoking status: Never  Smoker   . Smokeless tobacco: Never Used  . Alcohol Use: Yes     Comment: occ    Review of Systems Constitutional: Positive for diffuse muscle cramps. No fever/chills Eyes: No visual changes. ENT: Positive for nasal congestion. No sore throat. Cardiovascular: Denies chest pain. Respiratory: Positive for productive cough. Denies shortness of breath. Gastrointestinal: No abdominal pain.  No nausea, no vomiting.  No diarrhea.  No constipation. Genitourinary: Negative for dysuria. Musculoskeletal: Negative for back pain. Skin: Negative for rash. Neurological: Negative for headaches, focal weakness or numbness.  10-point ROS otherwise negative.  ____________________________________________   PHYSICAL EXAM:  VITAL SIGNS: ED Triage Vitals  Enc Vitals Group     BP 07/26/15 2341 144/88 mmHg     Pulse Rate 07/26/15 2341 81     Resp 07/26/15 2341 18     Temp 07/26/15 2341 99 F (37.2 C)     Temp Source 07/26/15 2341 Oral     SpO2 07/26/15 2341 97 %     Weight 07/26/15 2341 190 lb (86.183 kg)     Height 07/26/15 2341 5\' 11"  (1.803 m)     Head Cir --      Peak Flow --      Pain Score 07/26/15 2341 0     Pain Loc --      Pain Edu? --  Excl. in GC? --     Constitutional: Alert and oriented. Well appearing and in no acute distress. Eyes: Conjunctivae are normal. PERRL. EOMI. Head: Atraumatic. Nose: Nasal congestion noted. Mouth/Throat: Mucous membranes are moist.  Oropharynx non-erythematous. Neck: No stridor.   Cardiovascular: Normal rate, regular rhythm. Grossly normal heart sounds.  Good peripheral circulation. Respiratory: Normal respiratory effort.  No retractions. Lungs CTAB. Gastrointestinal: Soft and nontender. No distention. No abdominal bruits. No CVA tenderness. Musculoskeletal: No lower extremity tenderness nor edema.  No joint effusions. Neurologic:  Normal speech and language. No gross focal neurologic deficits are appreciated. No gait instability. Skin:   Skin is warm, dry and intact. No rash noted. Psychiatric: Mood and affect are normal. Speech and behavior are normal.  ____________________________________________   LABS (all labs ordered are listed, but only abnormal results are displayed)  Labs Reviewed  BASIC METABOLIC PANEL - Abnormal; Notable for the following:    Glucose, Bld 104 (*)    BUN 29 (*)    Calcium 8.7 (*)    All other components within normal limits  CK - Abnormal; Notable for the following:    Total CK 1114 (*)    All other components within normal limits  URINALYSIS COMPLETEWITH MICROSCOPIC ([facility] ONLY) - Abnormal; Notable for the following:    Color, Urine YELLOW (*)    APPearance CLEAR (*)    Leukocytes, UA TRACE (*)    All other components within normal limits  CBC WITH DIFFERENTIAL/PLATELET  MYOGLOBIN, URINE   ____________________________________________  EKG  None ____________________________________________  RADIOLOGY  Chest 2 view (viewed by me, interpreted per Dr. Grace Isaac): No active cardiopulmonary disease. ____________________________________________   PROCEDURES  Procedure(s) performed: None  Critical Care performed: No  ____________________________________________   INITIAL IMPRESSION / ASSESSMENT AND PLAN / ED COURSE  Pertinent labs & imaging results that were available during my care of the patient were reviewed by me and considered in my medical decision making (see chart for details).  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 Will check electrolytes and CK, infuse IV fluids, obtain chest x-ray and reassess.  ----------------------------------------- 3:44 AM on 07/27/2015 -----------------------------------------  Second liter IV fluids infusing. Patient resting in no acute distress. Updated patient of laboratory and urine findings. Labs notable for rhabdomyolysis  without renal failure. Advised patient to stay indoors and push fluids over the next several days. Strict return precautions given. Patient verbalizes understanding and agree with plan of care. ____________________________________________   FINAL CLINICAL IMPRESSION(S) / ED DIAGNOSES  Final diagnoses:  Non-traumatic rhabdomyolysis  Muscle cramps      Irean Hong, MD 07/27/15 [phone]

## 2015-07-27 NOTE — ED Notes (Signed)
Up to BR with steady gait; urine specimen collected and sent to lab; second liter NS infusing without difficulty; site unremarkable; pt with no requests or complaints

## 2015-07-27 NOTE — Discharge Instructions (Signed)
1. Stay indoors and drink plenty of fluids daily. 2. You may take Valium as needed for muscle cramps. 3. Return to the ER for worsening symptoms, persistent vomiting, difficulty breathing or other concerns.  Muscle Cramps and Spasms Muscle cramps and spasms occur when a muscle or muscles tighten and you have no control over this tightening (involuntary muscle contraction). They are a common problem and can develop in any muscle. The most common place is in the calf muscles of the leg. Both muscle cramps and muscle spasms are involuntary muscle contractions, but they also have differences:   Muscle cramps are sporadic and painful. They may last a few seconds to a quarter of an hour. Muscle cramps are often more forceful and last longer than muscle spasms.  Muscle spasms may or may not be painful. They may also last just a few seconds or much longer. CAUSES  It is uncommon for cramps or spasms to be due to a serious underlying problem. In many cases, the cause of cramps or spasms is unknown. Some common causes are:   Overexertion.   Overuse from repetitive motions (doing the same thing over and over).   Remaining in a certain position for a long period of time.   Improper preparation, form, or technique while performing a sport or activity.   Dehydration.   Injury.   Side effects of some medicines.   Abnormally low levels of the salts and ions in your blood (electrolytes), especially potassium and calcium. This could happen if you are taking water pills (diuretics) or you are pregnant.  Some underlying medical problems can make it more likely to develop cramps or spasms. These include, but are not limited to:   Diabetes.   Parkinson disease.   Hormone disorders, such as thyroid problems.   Alcohol abuse.   Diseases specific to muscles, joints, and bones.   Blood vessel disease where not enough blood is getting to the muscles.  HOME CARE INSTRUCTIONS   Stay well  hydrated. Drink enough water and fluids to keep your urine clear or pale yellow.  It may be helpful to massage, stretch, and relax the affected muscle.  For tight or tense muscles, use a warm towel, heating pad, or hot shower water directed to the affected area.  If you are sore or have pain after a cramp or spasm, applying ice to the affected area may relieve discomfort.  Put ice in a plastic bag.  Place a towel between your skin and the bag.  Leave the ice on for 15-20 minutes, 03-04 times a day.  Medicines used to treat a known cause of cramps or spasms may help reduce their frequency or severity. Only take over-the-counter or prescription medicines as directed by your caregiver. SEEK MEDICAL CARE IF:  Your cramps or spasms get more severe, more frequent, or do not improve over time.  MAKE SURE YOU:   Understand these instructions.  Will watch your condition.  Will get help right away if you are not doing well or get worse. Document Released: 05/20/2002 Document Revised: 03/25/2013 Document Reviewed: 11/14/2012 [facility] Patient Information 2015 [place], [place]. This information is not intended to replace advice given to you by your health care provider. Make sure you discuss any questions you have with your health care provider.  Rhabdomyolysis Rhabdomyolysis is the breakdown of muscle fibers due to injury. The injury may come from physical damage to the muscle like an injury but other causes are:  High fever (hyperthermia).  Seizures (  convulsions).  Low phosphate levels.  Diseases of metabolism.  Heatstroke.  Drug toxicity.  Over exertion.  Alcoholism.  Muscle is cut off from oxygen (anoxia).  The squeezing of nerves and blood vessels (compartment syndrome). Some drugs which may cause the breakdown of muscle are:  Antibiotics.  Statins.  Alcohol.  Animal toxins. Myoglobin is a substance which helps muscle use oxygen. When the muscle is damaged, the  myoglobin is released into the bloodstream. It is filtered out of the bloodstream by the kidneys. Myoglobin may block up the kidneys. This may cause damage, such as kidney failure. It also breaks down into other damaging toxic parts, which also cause kidney failure.  SYMPTOMS   Dark, red, or tea colored urine.  Weakness of affected muscles.  Weight gain from water retention.  Joint aches and pains.  Irregular heart from high potassium in the blood.  Muscle tenderness or aching.  Generalized weakness.  Seizures.  Feeling tired (fatigue). DIAGNOSIS  Your caregiver may find muscle tenderness on exam and suspect the problem. Urine tests and blood work can confirm the problem. TREATMENT   Early and aggressive treatment with large amounts of fluids may help prevent kidney failure.  Water producing medicine (diuretic) may be used to help flush the kidneys.  High potassium and calcium problems (electrolyte) in your blood may need treatment. HOME CARE INSTRUCTIONS  This problem is usually cared for in a hospital. If you are allowed to go home and require dialysis, make sure you keep all appointments for lab work and dialysis. Not doing so could result in death. Document Released: 12/07/04 Document Revised: 02/20/2012 Document Reviewed: 05/25/2009 [facility] Patient Information 2015 [place], [place]. This information is not intended to replace advice given to you by your health care provider. Make sure you discuss any questions you have with your health care provider.

## 2015-08-23 ENCOUNTER — Emergency Department
Admission: EM | Admit: 2015-08-23 | Discharge: 2015-08-23 | Disposition: A | Discharge status: Home / Self Care | Payer: Self-pay | Attending: Student | Admitting: Student

## 2015-08-23 ENCOUNTER — Encounter: Payer: Self-pay | Admitting: Emergency Medicine

## 2015-08-23 DIAGNOSIS — Z79899 Other long term (current) drug therapy: Secondary | ICD-10-CM | POA: Insufficient documentation

## 2015-08-23 DIAGNOSIS — M6282 Rhabdomyolysis: Secondary | ICD-10-CM | POA: Insufficient documentation

## 2015-08-23 DIAGNOSIS — Z792 Long term (current) use of antibiotics: Secondary | ICD-10-CM | POA: Insufficient documentation

## 2015-08-23 LAB — COMPREHENSIVE METABOLIC PANEL
ALT: 23 U/L (ref 17–63)
AST: 42 U/L — AB (ref 15–41)
Albumin: 4.9 g/dL (ref 3.5–5.0)
Alkaline Phosphatase: 64 U/L (ref 38–126)
Anion gap: 11 (ref 5–15)
BILIRUBIN TOTAL: 0.6 mg/dL (ref 0.3–1.2)
BUN: 25 mg/dL — AB (ref 6–20)
CHLORIDE: 101 mmol/L (ref 101–111)
CO2: 25 mmol/L (ref 22–32)
CREATININE: 1.39 mg/dL — AB (ref 0.61–1.24)
Calcium: 9.9 mg/dL (ref 8.9–10.3)
GFR calc Af Amer: >60 mL/min (ref >60)
GFR calc non Af Amer: >60 mL/min (ref >60)
Glucose, Bld: 91 mg/dL (ref 65–99)
Potassium: 4 mmol/L (ref 3.5–5.1)
Sodium: 137 mmol/L (ref 135–145)
Total Protein: 7.8 g/dL (ref 6.5–8.1)

## 2015-08-23 LAB — URINALYSIS COMPLETE WITH MICROSCOPIC (ARMC ONLY)
BACTERIA UA: NONE SEEN
BILIRUBIN URINE: NEGATIVE
Glucose, UA: NEGATIVE mg/dL
Hgb urine dipstick: NEGATIVE
Leukocytes, UA: NEGATIVE
Nitrite: NEGATIVE
PH: 5 (ref 5.0–8.0)
Protein, ur: NEGATIVE mg/dL
Specific Gravity, Urine: 1.027 (ref 1.005–1.030)

## 2015-08-23 LAB — LIPASE, BLOOD: LIPASE: 18 U/L — AB (ref 22–51)

## 2015-08-23 LAB — CK: CK TOTAL: 1416 U/L — AB (ref 49–397)

## 2015-08-23 MED ORDER — SODIUM CHLORIDE 0.9 % IV BOLUS (SEPSIS)
1000.0000 mL | Freq: Once | INTRAVENOUS | Status: AC
  Administered 2015-08-23: 1000 mL via INTRAVENOUS

## 2015-08-23 NOTE — ED Provider Notes (Signed)
[facility] Emergency Department Provider Note  ____________________________________________  Time seen: Approximately 3:05 AM  I have reviewed the triage vital signs and the nursing notes.   HISTORY  Chief Complaint Muscle Pain    HPI Kurt Aguirre is a 34 y.o. male with no chronic medical problems presents for evaluation of 2 days diffuse body/muscle cramping, intermittent, worse with movement. Current severity is moderate. He works in [employer] and was seen here on 07/27/2015 for similar complaints at which time he was diagnosed with rhabdomyolysis without acute renal failure. He received IV fluids and was discharged home. Currently he reports his symptoms have returned. Additionally on 07/27/2015 he was complaining of hiccups and had negative chest x-ray. He still reports intermittent hiccups. He had 2 episodes of nonbloody nonbilious emesis today. No diarrhea, fevers, chills, chest pain or difficulty breathing.   History reviewed. No pertinent past medical history.  There are no active problems to display for this patient.   Past Surgical History  Procedure Laterality Date  . Knee arthroplasty      Current Outpatient Rx  Name  Route  Sig  Dispense  Refill  . clotrimazole (LOTRIMIN) 1 % cream      Apply to affected area 2 times daily   15 g   0   . diazepam (VALIUM) 2 MG tablet   Oral   Take 1 tablet (2 mg total) by mouth every 8 (eight) hours as needed for muscle spasms.   15 tablet   0   . HYDROcodone-acetaminophen (NORCO/VICODIN) 5-325 MG per tablet   Oral   Take 1 tablet by mouth every 4 (four) hours as needed.   15 tablet   0   . hydrocortisone 2.5 % lotion   Topical   Apply topically 2 (two) times daily.   59 mL   0   . ibuprofen (ADVIL,MOTRIN) 800 MG tablet   Oral   Take 1 tablet (800 mg total) by mouth every 8 (eight) hours as needed for mild pain.   30 tablet   0   . penicillin v potassium (VEETID) 500 MG tablet    Oral   Take 1 tablet (500 mg total) by mouth 4 (four) times daily.   40 tablet   0     Allergies Review of patient's allergies indicates no known allergies.  History reviewed. No pertinent family history.  Social History Social History  Substance Use Topics  . Smoking status: Never Smoker   . Smokeless tobacco: Never Used  . Alcohol Use: Yes     Comment: occ    Review of Systems Constitutional: No fever/chills Eyes: No visual changes. ENT: No sore throat. Cardiovascular: Denies chest pain. Respiratory: Denies shortness of breath. Gastrointestinal: No abdominal pain.  No nausea, no vomiting.  No diarrhea.  No constipation. Genitourinary: Negative for dysuria. Musculoskeletal: Negative for back pain. Skin: Negative for rash. Neurological: Negative for headaches, focal weakness or numbness.  10-point ROS otherwise negative.  ____________________________________________   PHYSICAL EXAM:  VITAL SIGNS: ED Triage Vitals  Enc Vitals Group     BP 08/23/15 0220 131/86 mmHg     Pulse Rate 08/23/15 0220 79     Resp 08/23/15 0220 20     Temp 08/23/15 0220 98.1 F (36.7 C)     Temp Source 08/23/15 0220 Oral     SpO2 08/23/15 0220 98 %     Weight 08/23/15 0220 180 lb (81.647 kg)     Height 08/23/15 0220  (1.778  m)     Head Cir --      Peak Flow --      Pain Score 08/23/15 0219 8     Pain Loc --      Pain Edu? --      Excl. in GC? --     Constitutional: Alert and oriented. Well appearing and in no acute distress. Eyes: Conjunctivae are normal. PERRL. EOMI. Head: Atraumatic. Nose: No congestion/rhinnorhea. Mouth/Throat: Mucous membranes are moist.  Oropharynx non-erythematous. Neck: No stridor.   Cardiovascular: Normal rate, regular rhythm. Grossly normal heart sounds.  Good peripheral circulation. Respiratory: Normal respiratory effort.  No retractions. Lungs CTAB. Gastrointestinal: Soft and nontender. No distention. No abdominal bruits. No CVA  tenderness. Genitourinary: deferred Musculoskeletal: No lower extremity tenderness nor edema.  No joint effusions. Neurologic:  Normal speech and language. No gross focal neurologic deficits are appreciated. No gait instability. Skin:  Skin is warm, dry and intact. No rash noted. Psychiatric: Mood and affect are normal. Speech and behavior are normal.  ____________________________________________   LABS (all labs ordered are listed, but only abnormal results are displayed)  Labs Reviewed  CK - Abnormal; Notable for the following:    Total CK 1416 (*)    All other components within normal limits  COMPREHENSIVE METABOLIC PANEL - Abnormal; Notable for the following:    BUN 25 (*)    Creatinine, Ser 1.39 (*)    AST 42 (*)    All other components within normal limits  LIPASE, BLOOD - Abnormal; Notable for the following:    Lipase 18 (*)    All other components within normal limits  URINALYSIS COMPLETEWITH MICROSCOPIC ([facility] ONLY) - Abnormal; Notable for the following:    Color, Urine YELLOW (*)    APPearance CLEAR (*)    Ketones, ur 1+ (*)    Squamous Epithelial / LPF 0-5 (*)    All other components within normal limits   ____________________________________________  EKG  none ____________________________________________  RADIOLOGY  none ____________________________________________   PROCEDURES  Procedure(s) performed: None  Critical Care performed: No  ____________________________________________   INITIAL IMPRESSION / ASSESSMENT AND PLAN / ED COURSE  Pertinent labs & imaging results that were available during my care of the patient were reviewed by me and considered in my medical decision making (see chart for details).  Kurt Aguirre is a 33 y.o. male with no chronic medical problems presents for evaluation of 2 days diffuse body/muscle cramping, intermittent, worse with movement. On exam, he is very well-appearing and in no acute distress, vital signs  stable, he is afebrile. He appears to be resting comfortably. Suspect recurrent acute related illness, possible recurrence of rhabdo my lysis. We'll obtain basic labs, give IV fluids, reassess for disposition. With regards the hiccups,  They have been ongoing and he had a negative chest x-ray on 07/27/2015 when he was here, currently has no hiccups, I have advised him to follow up with his primary care doctor about this. He has no hiccups on exam currently.  ----------------------------------------- 6:15 AM on 08/23/2015 -----------------------------------------  Patient is sleeping but awakens to voice. He appears very comfortable. CK elevated at 1400. Creatinine is mildly elevated at 1.39, it was 1.24 nearly 3 weeks ago, no prior available for comparison. Clinical picture consistent with acute rhabdomyolysis without renal failure. He received IV fluids, we discussed return precautions, need for adequate hydration and to stay out of the sun as much as possible. He voices understanding and is comfortable with discharge.  ____________________________________________  FINAL CLINICAL IMPRESSION(S) / ED DIAGNOSES  Final diagnoses:  Non-traumatic rhabdomyolysis      Gayla Doss, MD 08/23/15 [phone]

## 2015-08-23 NOTE — ED Notes (Signed)
Patient reports having muscle spasms for 2 days all over his body.  Also reports having hiccups for the past month.

## 2015-08-23 NOTE — ED Notes (Signed)
Ambulated out of department with significant other. No Needs voices. No difficulty ambulating.

## 2015-08-23 NOTE — ED Notes (Signed)
Patient transported to X-ray 

## 2015-08-23 NOTE — ED Notes (Signed)
Report received from Ann RN

## 2015-12-08 ENCOUNTER — Emergency Department
Admission: EM | Admit: 2015-12-08 | Discharge: 2015-12-08 | Disposition: A | Discharge status: Home / Self Care | Payer: Self-pay | Attending: Emergency Medicine | Admitting: Emergency Medicine

## 2015-12-08 DIAGNOSIS — Z792 Long term (current) use of antibiotics: Secondary | ICD-10-CM | POA: Insufficient documentation

## 2015-12-08 DIAGNOSIS — Z202 Contact with and (suspected) exposure to infections with a predominantly sexual mode of transmission: Secondary | ICD-10-CM | POA: Insufficient documentation

## 2015-12-08 DIAGNOSIS — Z79899 Other long term (current) drug therapy: Secondary | ICD-10-CM | POA: Insufficient documentation

## 2015-12-08 MED ORDER — LIDOCAINE HCL (PF) 1 % IJ SOLN
INTRAMUSCULAR | Status: AC
  Administered 2015-12-08: 5 mL
  Filled 2015-12-08: qty 5

## 2015-12-08 MED ORDER — AZITHROMYCIN 250 MG PO TABS
1000.0000 mg | ORAL_TABLET | Freq: Once | ORAL | Status: AC
  Administered 2015-12-08: 1000 mg via ORAL
  Filled 2015-12-08: qty 4

## 2015-12-08 MED ORDER — CEFTRIAXONE SODIUM 250 MG IJ SOLR
250.0000 mg | Freq: Once | INTRAMUSCULAR | Status: AC
  Administered 2015-12-08: 250 mg via INTRAMUSCULAR
  Filled 2015-12-08: qty 250

## 2015-12-08 NOTE — ED Notes (Signed)
Patient reports penile discharge.

## 2015-12-08 NOTE — ED Provider Notes (Signed)
[facility] Emergency Department Provider Note ____________________________________________  Time seen: 1825  I have reviewed the triage vital signs and the nursing notes.  HISTORY  Chief Complaint  Penile Discharge  HPI Kurt Aguirre is a 34 y.o. male reports to the ED for evaluation of penile discharge last 2 days. He denies any fevers, chills, or sweats. He reports an umprotected sexual encounter about 3 days prior to arrival.  History reviewed. No pertinent past medical history.  There are no active problems to display for this patient.   Past Surgical History  Procedure Laterality Date  . Knee arthroplasty      Current Outpatient Rx  Name  Route  Sig  Dispense  Refill  . clotrimazole (LOTRIMIN) 1 % cream      Apply to affected area 2 times daily   15 g   0   . diazepam (VALIUM) 2 MG tablet   Oral   Take 1 tablet (2 mg total) by mouth every 8 (eight) hours as needed for muscle spasms.   15 tablet   0   . HYDROcodone-acetaminophen (NORCO/VICODIN) 5-325 MG per tablet   Oral   Take 1 tablet by mouth every 4 (four) hours as needed.   15 tablet   0   . hydrocortisone 2.5 % lotion   Topical   Apply topically 2 (two) times daily.   59 mL   0   . ibuprofen (ADVIL,MOTRIN) 800 MG tablet   Oral   Take 1 tablet (800 mg total) by mouth every 8 (eight) hours as needed for mild pain.   30 tablet   0   . penicillin v potassium (VEETID) 500 MG tablet   Oral   Take 1 tablet (500 mg total) by mouth 4 (four) times daily.   40 tablet   0    Allergies Review of patient's allergies indicates no known allergies.  No family history on file.  Social History Social History  Substance Use Topics  . Smoking status: Never Smoker   . Smokeless tobacco: Never Used  . Alcohol Use: Yes     Comment: occ   Review of Systems  Constitutional: Negative for fever. Eyes: Negative for visual changes. ENT: Negative for sore throat. Cardiovascular:  Negative for chest pain. Respiratory: Negative for shortness of breath. Gastrointestinal: Negative for abdominal pain, vomiting and diarrhea. Genitourinary: Negative for dysuria. Reports penile discharge Musculoskeletal: Negative for back pain. Skin: Negative for rash. Neurological: Negative for headaches, focal weakness or numbness. ____________________________________________  PHYSICAL EXAM:  VITAL SIGNS: ED Triage Vitals  Enc Vitals Group     BP 12/08/15 1819 140/84 mmHg     Pulse Rate 12/08/15 1819 60     Resp 12/08/15 1819 16     Temp 12/08/15 1819 98.5 F (36.9 C)     Temp Source 12/08/15 1819 Oral     SpO2 12/08/15 1819 95 %     Weight 12/08/15 1819 190 lb (86.183 kg)     Height 12/08/15 1819 5\' 11"  (1.803 m)     Head Cir --      Peak Flow --      Pain Score 12/08/15 1819 6     Pain Loc --      Pain Edu? --      Excl. in GC? --    Constitutional: Alert and oriented. Well appearing and in no distress. Head: Normocephalic and atraumatic.      Eyes: Conjunctivae are normal. PERRL. Normal extraocular movements  Ears: Canals clear. TMs intact bilaterally.   Nose: No congestion/rhinorrhea.   Mouth/Throat: Mucous membranes are moist.   Neck: Supple. No thyromegaly. Hematological/Lymphatic/Immunological: No cervical lymphadenopathy. Cardiovascular: Normal rate, regular rhythm.  Respiratory: Normal respiratory effort. No wheezes/rales/rhonchi. Gastrointestinal: Soft and nontender. No distention. GU: deferred Musculoskeletal: Nontender with normal range of motion in all extremities.  Neurologic:  Normal gait without ataxia. Normal speech and language. No gross focal neurologic deficits are appreciated. Skin:  Skin is warm, dry and intact. No rash noted. Psychiatric: Mood and affect are normal. Patient exhibits appropriate insight and judgment. ____________________________________________   LABS (pertinent positives/negatives) Labs Reviewed  CHLAMYDIA/NGC RT  PCR ([facility] ONLY)  ____________________________________________  PROCEDURES  Azithromycin 1000 mg PO Rocephin 250 mg IM ____________________________________________  INITIAL IMPRESSION / ASSESSMENT AND PLAN / ED COURSE  Patient with empiric treatment for gonorrhea chlamydia following an unprotected sexual encounter, and onset of penile discharge. He is advised to avoid any intermittent activity until one week and symptoms have resolved. He is advised to follow-up with [facility] for ongoing symptoms. ____________________________________________  FINAL CLINICAL IMPRESSION(S) / ED DIAGNOSES  Final diagnoses:  Exposure to STD      Lissa Hoard, PA-C 12/08/15 1838  Sharman Cheek, MD 12/08/15 2227

## 2015-12-08 NOTE — ED Notes (Signed)
Pt c/o penile discharge with pain for the past 2 days.

## 2015-12-08 NOTE — Discharge Instructions (Signed)
Sexually Transmitted Disease °A sexually transmitted disease (STD) is a disease or infection that may be passed (transmitted) from person to person, usually during sexual activity. This may happen by way of saliva, semen, blood, vaginal mucus, or urine. Common STDs include: °· Gonorrhea. °· Chlamydia. °· Syphilis. °· HIV and AIDS. °· Genital herpes. °· Hepatitis B and C. °· Trichomonas. °· Human papillomavirus (HPV). °· Pubic lice. °· Scabies. °· Mites. °· Bacterial vaginosis. °WHAT ARE CAUSES OF STDs? °An STD may be caused by bacteria, a virus, or parasites. STDs are often transmitted during sexual activity if one person is infected. However, they may also be transmitted through nonsexual means. STDs may be transmitted after:  °· Sexual intercourse with an infected person. °· Sharing sex toys with an infected person. °· Sharing needles with an infected person or using unclean piercing or tattoo needles. °· Having intimate contact with the genitals, mouth, or rectal areas of an infected person. °· Exposure to infected fluids during birth. °WHAT ARE THE SIGNS AND SYMPTOMS OF STDs? °Different STDs have different symptoms. Some people may not have any symptoms. If symptoms are present, they may include: °· Painful or bloody urination. °· Pain in the pelvis, abdomen, vagina, anus, throat, or eyes. °· A skin rash, itching, or irritation. °· Growths, ulcerations, blisters, or sores in the genital and anal areas. °· Abnormal vaginal discharge with or without bad odor. °· Penile discharge in men. °· Fever. °· Pain or bleeding during sexual intercourse. °· Swollen glands in the groin area. °· Yellow skin and eyes (jaundice). This is seen with hepatitis. °· Swollen testicles. °· Infertility. °· Sores and blisters in the mouth. °HOW ARE STDs DIAGNOSED? °To make a diagnosis, your health care provider may: °· Take a medical history. °· Perform a physical exam. °· Take a sample of any discharge to examine. °· Swab the throat,  cervix, opening to the penis, rectum, or vagina for testing. °· Test a sample of your first morning urine. °· Perform blood tests. °· Perform a Pap test, if this applies. °· Perform a colposcopy. °· Perform a laparoscopy. °HOW ARE STDs TREATED? °Treatment depends on the STD. Some STDs may be treated but not cured. °· Chlamydia, gonorrhea, trichomonas, and syphilis can be cured with antibiotic medicine. °· Genital herpes, hepatitis, and HIV can be treated, but not cured, with prescribed medicines. The medicines lessen symptoms. °· Genital warts from HPV can be treated with medicine or by freezing, burning (electrocautery), or surgery. Warts may come back. °· HPV cannot be cured with medicine or surgery. However, abnormal areas may be removed from the cervix, vagina, or vulva. °· If your diagnosis is confirmed, your recent sexual partners need treatment. This is true even if they are symptom-free or have a negative culture or evaluation. They should not have sex until their health care providers say it is okay. °· Your health care provider may test you for infection again 3 months after treatment. °HOW CAN I REDUCE MY RISK OF GETTING AN STD? °Take these steps to reduce your risk of getting an STD: °· Use latex condoms, dental dams, and water-soluble lubricants during sexual activity. Do not use petroleum jelly or oils. °· Avoid having multiple sex partners. °· Do not have sex with someone who has other sex partners °· Do not have sex with anyone you do not know or who is at high risk for an STD. °· Avoid risky sex practices that can break your skin. °· Do not have sex   if you have open sores on your mouth or skin.  Avoid drinking too much alcohol or taking illegal drugs. Alcohol and drugs can affect your judgment and put you in a vulnerable position.  Avoid engaging in oral and anal sex acts.  Get vaccinated for HPV and hepatitis. If you have not received these vaccines in the past, talk to your health care  provider about whether one or both might be right for you.  If you are at risk of being infected with HIV, it is recommended that you take a prescription medicine daily to prevent HIV infection. This is called pre-exposure prophylaxis (PrEP). You are considered at risk if:  You are a man who has sex with other men (MSM).  You are a heterosexual man or woman and are sexually active with more than one partner.  You take drugs by injection.  You are sexually active with a partner who has HIV.  Talk with your health care provider about whether you are at high risk of being infected with HIV. If you choose to begin PrEP, you should first be tested for HIV. You should then be tested every 3 months for as long as you are taking PrEP. WHAT SHOULD I DO IF I THINK I HAVE AN STD?  See your health care provider.  Tell your sexual partner(s). They should be tested and treated for any STDs.  Do not have sex until your health care provider says it is okay. WHEN SHOULD I GET IMMEDIATE MEDICAL CARE? Contact your health care provider right away if:   You have severe abdominal pain.  You are a man and notice swelling or pain in your testicles.  You are a woman and notice swelling or pain in your vagina.   This information is not intended to replace advice given to you by your health care provider. Make sure you discuss any questions you have with your health care provider.   Document Released: 02/18/2003 Document Revised: 12/19/2014 Document Reviewed: 06/18/2013 Elsevier Interactive Patient Education 2016 [employer]   You have been treated for gonorrhea and chlamydia based on your symptoms. You should avoid intimate activity or contact until your symptoms have resolved. Any sexual contacts should also be advised to seek treatment.

## 2015-12-09 LAB — CHLAMYDIA/NGC RT PCR (ARMC ONLY)
CHLAMYDIA TR: NOT DETECTED
N gonorrhoeae: DETECTED — AB

## 2015-12-10 ENCOUNTER — Telehealth: Payer: Self-pay | Admitting: Emergency Medicine

## 2015-12-10 NOTE — ED Notes (Signed)
Called patient to inform of positive gonorrhea test.  pateint was treated during ED visit, but needs to inform partner to get treatment at health department.  Left message asking him to call me.

## 2016-03-06 ENCOUNTER — Encounter: Payer: Self-pay | Admitting: Emergency Medicine

## 2016-03-06 ENCOUNTER — Emergency Department: Payer: Self-pay

## 2016-03-06 ENCOUNTER — Emergency Department
Admission: EM | Admit: 2016-03-06 | Discharge: 2016-03-06 | Disposition: A | Discharge status: Home / Self Care | Payer: Self-pay | Attending: Emergency Medicine | Admitting: Emergency Medicine

## 2016-03-06 DIAGNOSIS — Y9289 Other specified places as the place of occurrence of the external cause: Secondary | ICD-10-CM | POA: Insufficient documentation

## 2016-03-06 DIAGNOSIS — W231XXA Caught, crushed, jammed, or pinched between stationary objects, initial encounter: Secondary | ICD-10-CM | POA: Insufficient documentation

## 2016-03-06 DIAGNOSIS — S61217A Laceration without foreign body of left little finger without damage to nail, initial encounter: Secondary | ICD-10-CM | POA: Insufficient documentation

## 2016-03-06 DIAGNOSIS — Z792 Long term (current) use of antibiotics: Secondary | ICD-10-CM | POA: Insufficient documentation

## 2016-03-06 DIAGNOSIS — Z79899 Other long term (current) drug therapy: Secondary | ICD-10-CM | POA: Insufficient documentation

## 2016-03-06 DIAGNOSIS — Y9389 Activity, other specified: Secondary | ICD-10-CM | POA: Insufficient documentation

## 2016-03-06 DIAGNOSIS — Y990 Civilian activity done for income or pay: Secondary | ICD-10-CM | POA: Insufficient documentation

## 2016-03-06 DIAGNOSIS — Z7952 Long term (current) use of systemic steroids: Secondary | ICD-10-CM | POA: Insufficient documentation

## 2016-03-06 MED ORDER — IBUPROFEN 800 MG PO TABS
ORAL_TABLET | ORAL | Status: AC
  Filled 2016-03-06: qty 1

## 2016-03-06 MED ORDER — IBUPROFEN 800 MG PO TABS
800.0000 mg | ORAL_TABLET | Freq: Once | ORAL | Status: AC
  Administered 2016-03-06: 800 mg via ORAL

## 2016-03-06 NOTE — ED Notes (Signed)
Patient states that he was at work and a wheel barrel fell on his left fifth finger. Patient with swelling and small laceration to finger. Patient states that he does not want to file workers comp at this time.

## 2016-03-06 NOTE — ED Notes (Signed)
Pt reports his finger was caught between a wheel barrow and dump trailer with concrete at approx 1130 pm 3/25. Pt rates pain at 8 out of 10 in left fifth finger.

## 2016-03-06 NOTE — ED Notes (Signed)
Applied xeroform gauze and rolled gauze to wound per MD Dolores FrameSung order

## 2016-03-06 NOTE — ED Provider Notes (Signed)
[facility] Emergency Department Provider Note  ____________________________________________  Time seen: Approximately 3:05 AM  I have reviewed the triage vital signs and the nursing notes.   HISTORY  Chief Complaint Hand Pain    HPI Kurt Aguirre is a 35 y.o. male who presents to the ED from work with a chief complaint of left finger crush injury. Patient is left-hand dominant, and states he smashed his finger between a wheelbarrow and a dump truck. Does not wish to file Workmen's Comp. Tetanus is up-to-date. Denies associated swelling, bleeding, numbness/tingling. Denies recent fever, chills, chest pain, shortness of breath, abdominal pain, nausea, vomiting, diarrhea.Nothing makes his pain better or worse.   Past medical history None  There are no active problems to display for this patient.   Past Surgical History  Procedure Laterality Date  . Knee arthroplasty      Current Outpatient Rx  Name  Route  Sig  Dispense  Refill  . clotrimazole (LOTRIMIN) 1 % cream      Apply to affected area 2 times daily   15 g   0   . diazepam (VALIUM) 2 MG tablet   Oral   Take 1 tablet (2 mg total) by mouth every 8 (eight) hours as needed for muscle spasms.   15 tablet   0   . HYDROcodone-acetaminophen (NORCO/VICODIN) 5-325 MG per tablet   Oral   Take 1 tablet by mouth every 4 (four) hours as needed.   15 tablet   0   . hydrocortisone 2.5 % lotion   Topical   Apply topically 2 (two) times daily.   59 mL   0   . ibuprofen (ADVIL,MOTRIN) 800 MG tablet   Oral   Take 1 tablet (800 mg total) by mouth every 8 (eight) hours as needed for mild pain.   30 tablet   0   . penicillin v potassium (VEETID) 500 MG tablet   Oral   Take 1 tablet (500 mg total) by mouth 4 (four) times daily.   40 tablet   0     Allergies Review of patient's allergies indicates no known allergies.  No family history on file.  Social History Social History   Substance Use Topics  . Smoking status: Never Smoker   . Smokeless tobacco: Never Used  . Alcohol Use: Yes     Comment: occ    Review of Systems  Constitutional: No fever/chills. Eyes: No visual changes. ENT: No sore throat. Cardiovascular: Denies chest pain. Respiratory: Denies shortness of breath. Gastrointestinal: No abdominal pain.  No nausea, no vomiting.  No diarrhea.  No constipation. Genitourinary: Negative for dysuria. Musculoskeletal: Positive for left finger pain. Negative for back pain. Skin: Negative for rash. Neurological: Negative for headaches, focal weakness or numbness.  10-point ROS otherwise negative.  ____________________________________________   PHYSICAL EXAM:  VITAL SIGNS: ED Triage Vitals  Enc Vitals Group     BP 03/06/16 0020 124/104 mmHg     Pulse Rate 03/06/16 0020 90     Resp 03/06/16 0020 20     Temp 03/06/16 0020 98.7 F (37.1 C)     Temp Source 03/06/16 0232 Oral     SpO2 03/06/16 0020 98 %     Weight 03/06/16 0018 202 lb (91.627 kg)     Height 03/06/16 0018  (1.803 m)     Head Cir --      Peak Flow --      Pain Score 03/06/16 0019 7  Pain Loc --      Pain Edu? --      Excl. in GC? --     Constitutional: Alert and oriented. Well appearing and in no acute distress. Eyes: Conjunctivae are normal. PERRL. EOMI. Head: Atraumatic. Nose: No congestion/rhinnorhea. Mouth/Throat: Mucous membranes are moist.  Oropharynx non-erythematous. Neck: No stridor.   Cardiovascular: Normal rate, regular rhythm. Grossly normal heart sounds.  Good peripheral circulation. Respiratory: Normal respiratory effort.  No retractions. Lungs CTAB. Gastrointestinal: Soft and nontender. No distention. No abdominal bruits. No CVA tenderness. Musculoskeletal: Left fifth digit finger pad with very small avulsion/abrasion. There is no laceration that is amenable to suturing. There is no active bleeding. 2+ radial pulses. Brisk, less than 5 second capillary  refill. No lower extremity tenderness nor edema.  No joint effusions. Neurologic:  Normal speech and language. No gross focal neurologic deficits are appreciated. No gait instability. Skin:  Skin is warm, dry and intact. No rash noted. Psychiatric: Mood and affect are normal. Speech and behavior are normal.  ____________________________________________   LABS (all labs ordered are listed, but only abnormal results are displayed)  Labs Reviewed - No data to display ____________________________________________  EKG  None ____________________________________________  RADIOLOGY  Left little finger x-rays (reviewed by me, interpreted per Dr. Andria MeuseStevens): Soft tissue laceration. No acute bony abnormalities. ____________________________________________   PROCEDURES  Procedure(s) performed: None  Critical Care performed: No  ____________________________________________   INITIAL IMPRESSION / ASSESSMENT AND PLAN / ED COURSE  Pertinent labs & imaging results that were available during my care of the patient were reviewed by me and considered in my medical decision making (see chart for details).  35 year old male who presents with small left fifth finger pad avulsion type laceration which does not require sutures. Wound was cleansed and dressed with nonadherent dressing by nurse. Motrin administered for discomfort. Strict return precautions given. Patient verbalizes understanding and agrees with plan of care. ____________________________________________   FINAL CLINICAL IMPRESSION(S) / ED DIAGNOSES  Final diagnoses:  Laceration of fifth finger, left, initial encounter      Irean HongJade J Jakirah Zaun, MD 03/06/16 [phone]

## 2016-03-06 NOTE — ED Notes (Signed)
Reviewed d/c instructions, follow-up care, OTC pain medication, and laceration care with pt. Pt verbalized understanding.

## 2016-03-06 NOTE — ED Notes (Signed)
Pt. Reports severe heartburn after eating a banana about 1700 today and then he "vomitted a centipede or a worm". Pt. Reports he has been "fine" since vomiting. ("but maybe the worm was on the ground when I vomitted") Pt. Says that he has reflux after everything he eats x1 year. Pt. States he is just scared of what could be wrong and wants to follow up with a physical.

## 2016-03-06 NOTE — ED Notes (Signed)
MD Sung at bedside. 

## 2016-03-06 NOTE — ED Notes (Signed)
Pt. Reports that he smashed his finger between a wheel barrel and a dump truck. Incident happened at work but pt. Does not want to file workers comp. PT. Reports pain 9/10.

## 2016-03-06 NOTE — Discharge Instructions (Signed)
1. You may take Tylenol and/or ibuprofen as needed for discomfort. 2. Keep wound clean and dry. 3. Return to the ER for worsening symptoms, fever, increased redness or swelling of your finger, or other concerns.  Nonsutured Laceration Care A laceration is a cut that goes through all layers of the skin and extends into the tissue that is right under the skin. This type of cut is usually stitched up (sutured) or closed with tape (adhesive strips) or skin glue shortly after the injury happens. However, if the wound is dirty or if several hours pass before medical treatment is provided, it is likely that germs (bacteria) will enter the wound. Closing a laceration after bacteria have entered it increases the risk of infection. In these cases, your health care provider may leave the laceration open (nonsutured) and cover it with a bandage. This type of treatment helps prevent infection and allows the wound to heal from the deepest layer of tissue damage up to the surface. An open fracture is a type of injury that may involve nonsutured lacerations. An open fracture is a break in a bone that happens along with one or more lacerations through the skin that is near the fracture site. HOW TO CARE FOR YOUR NONSUTURED LACERATION  Take or apply over-the-counter and prescription medicines only as told by your health care provider.  If you were prescribed an antibiotic medicine, take or apply it as told by your health care provider. Do not stop using the antibiotic even if your condition improves.  Clean the wound one time each day or as told by your health care provider.  Wash the wound with mild soap and water.  Rinse the wound with water to remove all soap.  Pat your wound dry with a clean towel. Do not rub the wound.  Do not inject anything into the wound unless your health care provider told you to.  Change any bandages (dressings) as told by your health care provider. This includes changing the  dressing if it gets wet, dirty, or starts to smell bad.  Keep the dressing dry until your health care provider says it can be removed. Do not take baths, swim, or do anything that puts your wound underwater until your health care provider approves.  Raise (elevate) the injured area above the level of your heart while you are sitting or lying down, if possible.  Do not scratch or pick at the wound.  Check your wound every day for signs of infection. Watch for:  Redness, swelling, or pain.  Fluid, blood, or pus.  Keep all follow-up visits as told by your health care provider. This is important. SEEK MEDICAL CARE IF:  You received a tetanus and shot and you have swelling, severe pain, redness, or bleeding at the injection site.   You have a fever.  Your pain is not controlled with medicine.  You have increased redness, swelling, or pain at the site of your wound.  You have fluid, blood, or pus coming from your wound.  You notice a bad smell coming from your wound or your dressing.  You notice something coming out of the wound, such as wood or glass.  You notice a change in the color of your skin near your wound.  You develop a new rash.  You need to change the dressing frequently due to fluid, blood, or pus draining from the wound.  You develop numbness around your wound. SEEK IMMEDIATE MEDICAL CARE IF:  Your pain suddenly increases  and is severe.  You develop severe swelling around the wound.  The wound is on your hand or foot and you cannot properly move a finger or toe.  The wound is on your hand or foot and you notice that your fingers or toes look pale or bluish.  You have a red streak going away from your wound.   This information is not intended to replace advice given to you by your health care provider. Make sure you discuss any questions you have with your health care provider.   Document Released: 10/26/2006 Document Revised: 04/14/2015 Document Reviewed:  11/24/2014 Elsevier Interactive Patient Education [employer]

## 2016-12-15 IMAGING — CR DG FINGER LITTLE 2+V*L*
3 series · 3 of 3 positions shown · non-contrast
Comparison: None.

CLINICAL DATA: Pain and swelling with laceration to the left fifth
finger after injury.

EXAM:
LEFT LITTLE FINGER 2+V

[finger ap]
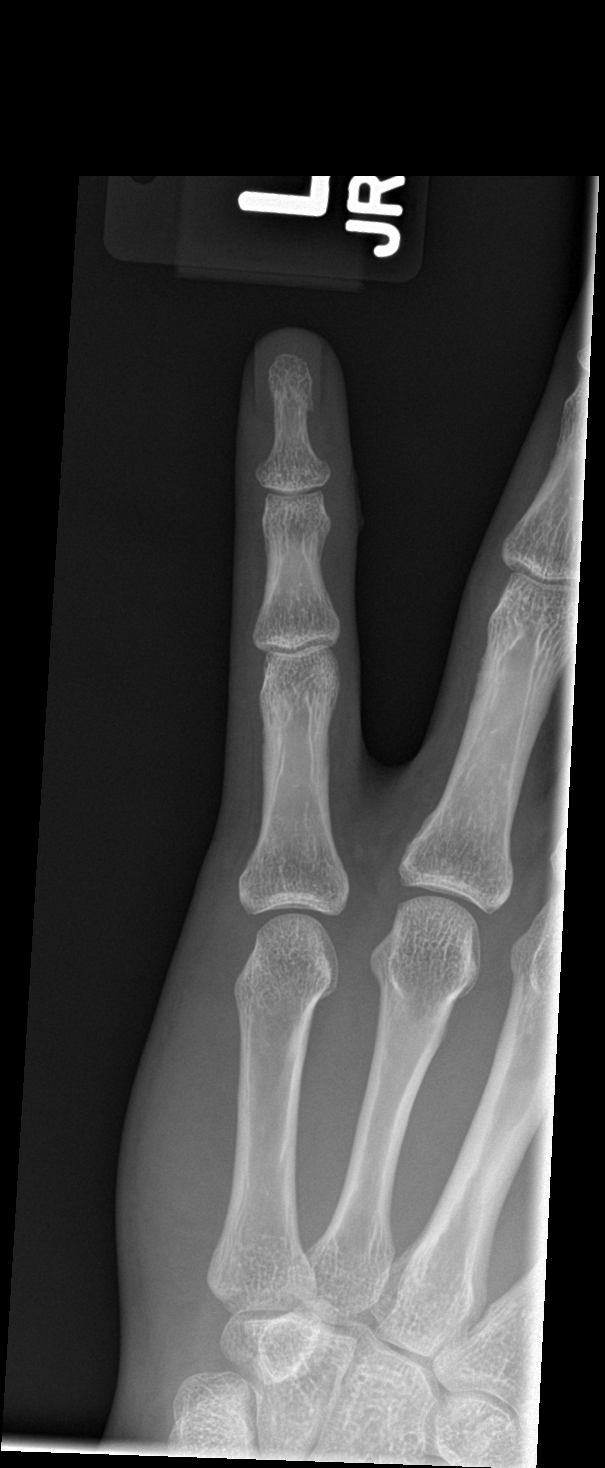

[finger obl]
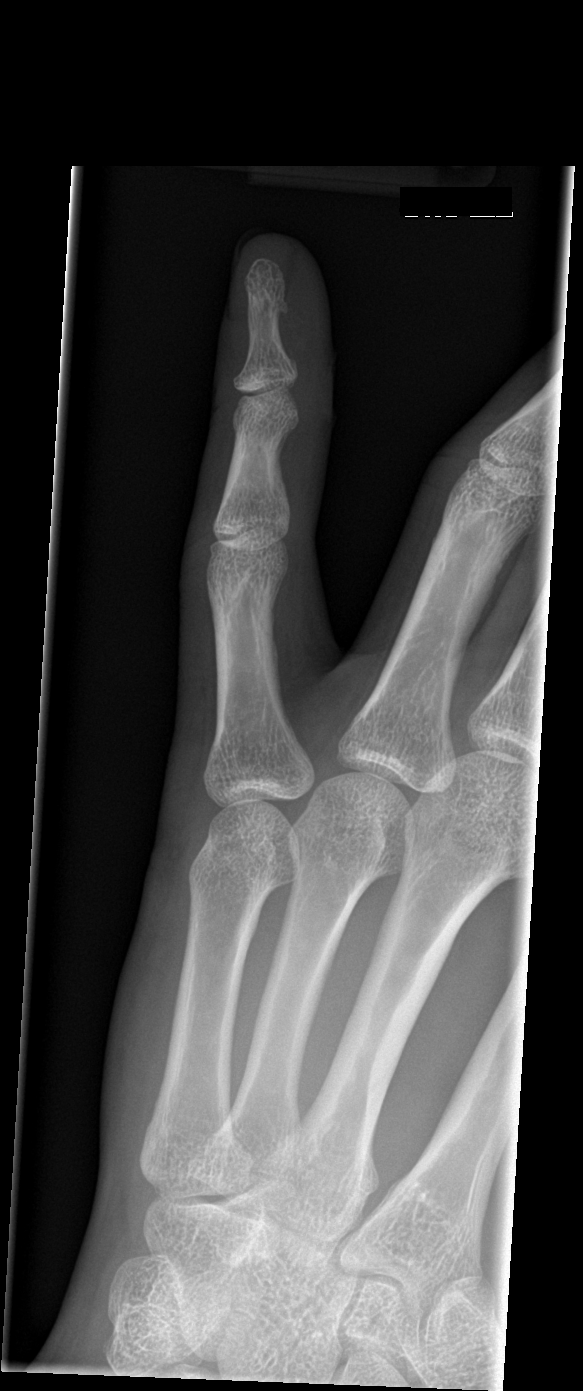

[finger lat]
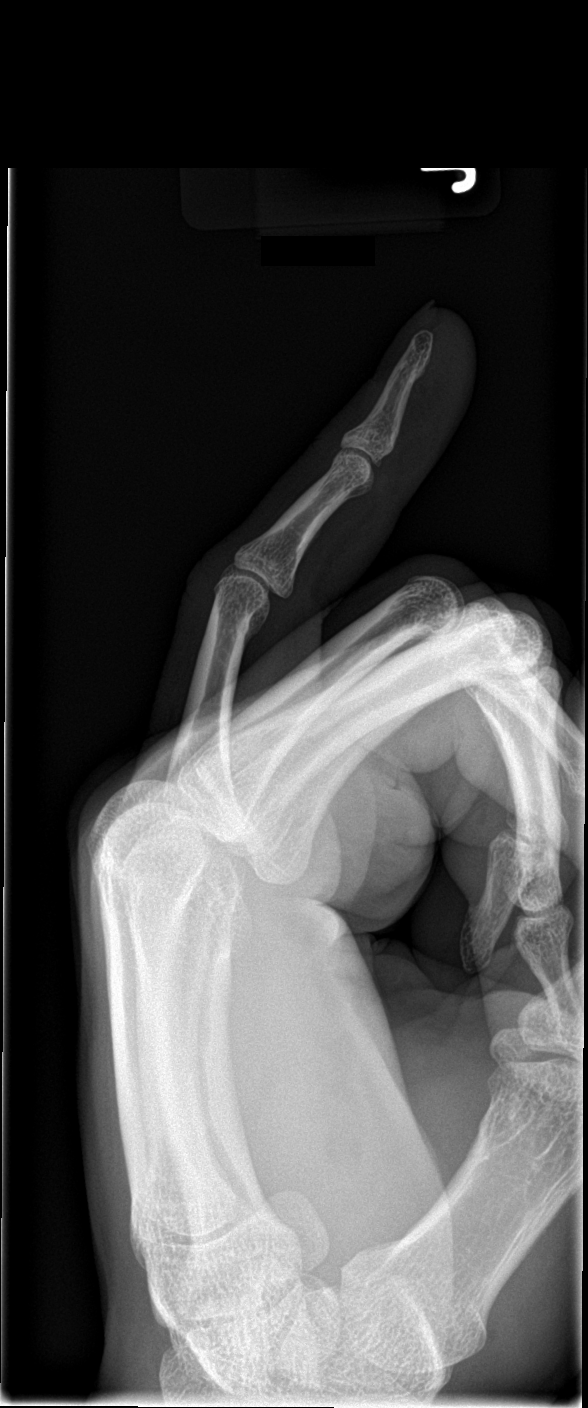

[3 of 3 positions shown; findings below may reference images not displayed]

FINDINGS: Soft tissue defect in the distal left fifth finger along the radial
side of the distal interphalangeal joint. No radiopaque soft tissue
foreign bodies are demonstrated. Bones appear intact. No evidence of
acute fracture or dislocation.
IMPRESSION: Soft tissue laceration.  No acute bony abnormalities.
